# Patient Record
Sex: Female | Born: 1995 | Race: Black or African American | Hispanic: No | Marital: Single | Smoking: Never smoker
Health system: Southern US, Community
[De-identification: ages and names within clinical notes are randomized; demographics above are authoritative.]

## PROBLEM LIST (undated history)

## (undated) ENCOUNTER — Emergency Department (HOSPITAL_COMMUNITY): Discharge status: Absent without leave | Payer: Self-pay

## (undated) DIAGNOSIS — K9041 Non-celiac gluten sensitivity: Secondary | ICD-10-CM

## (undated) HISTORY — PX: TONSILLECTOMY: SUR1361

## (undated) HISTORY — PX: ADENOIDECTOMY: SUR15

---
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
## 2018-02-09 IMAGING — DX DG LUMBAR SPINE COMPLETE 4+V
5 series · 5 of 5 positions shown · non-contrast
Comparison: Abdominal radiograph performed 06/18/2011

CLINICAL DATA: Subacute onset of lower back pain, left greater than
right. Initial encounter.

EXAM:
LUMBAR SPINE - COMPLETE 4+ VIEW

[l-spine ap]
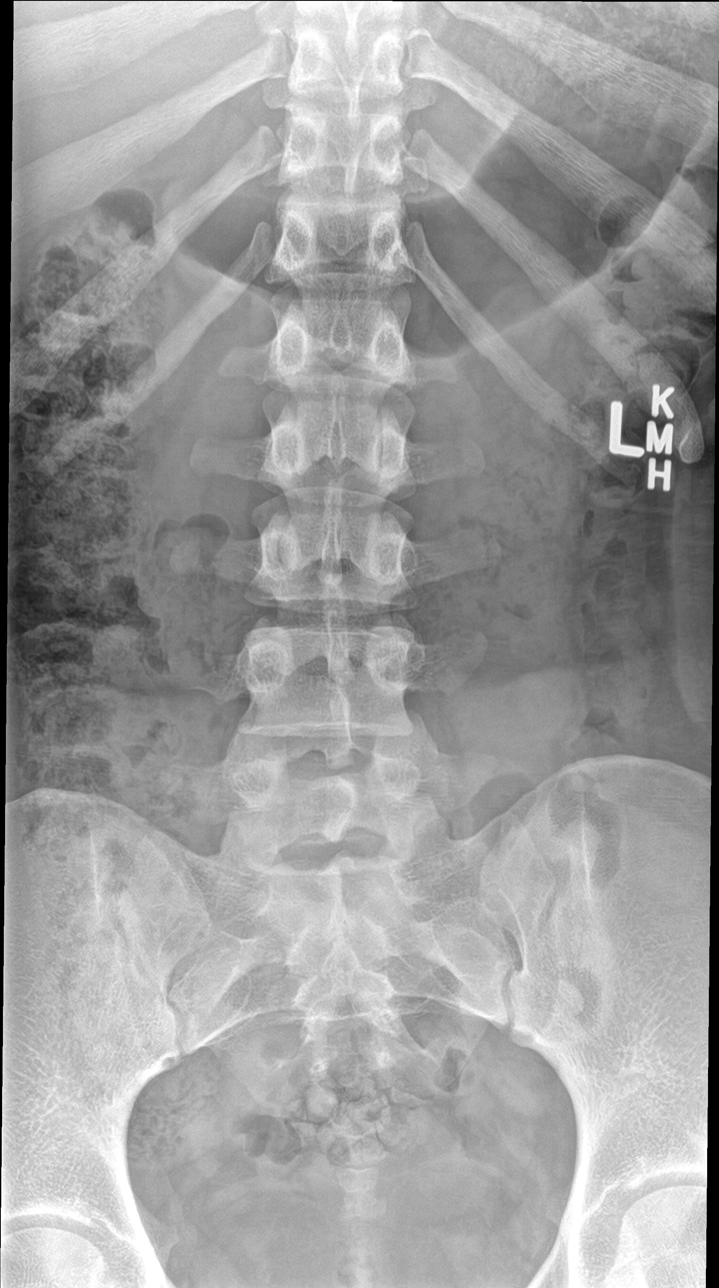

[l-spine obl (1 of 2)]
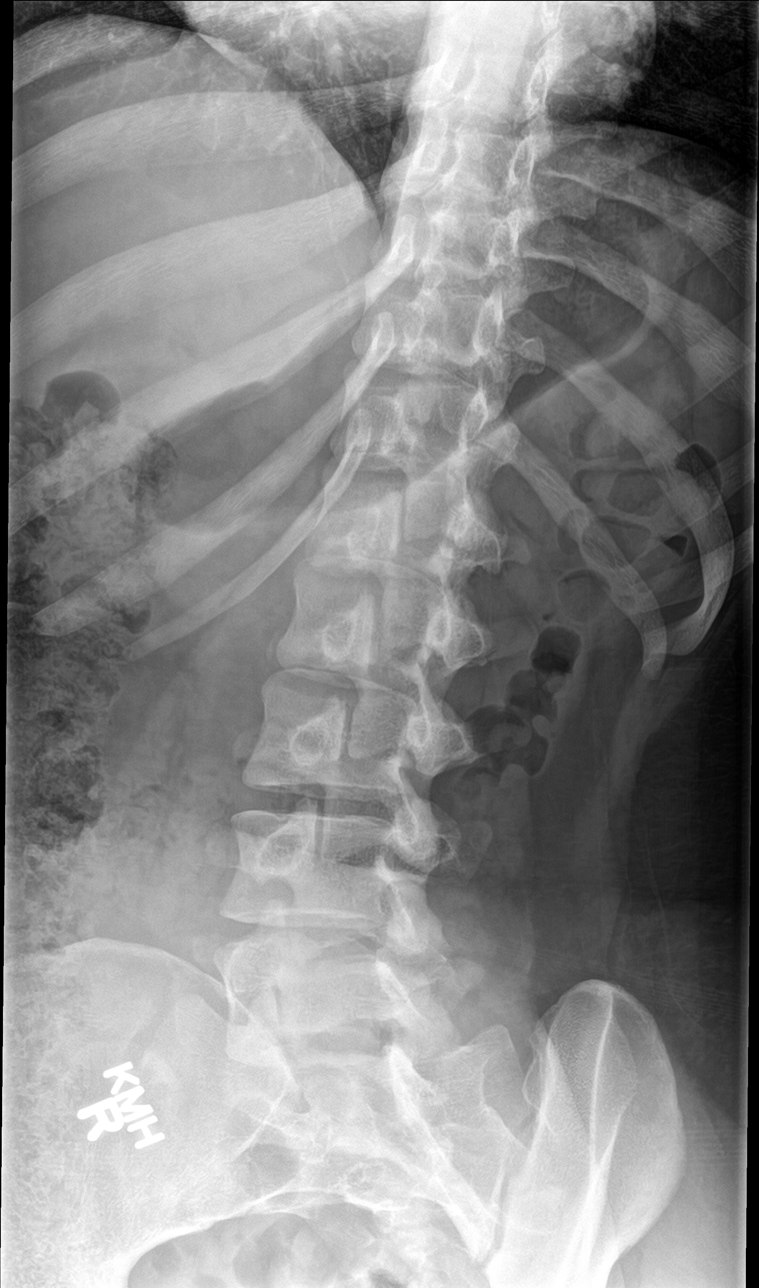

[l-spine obl (2 of 2)]
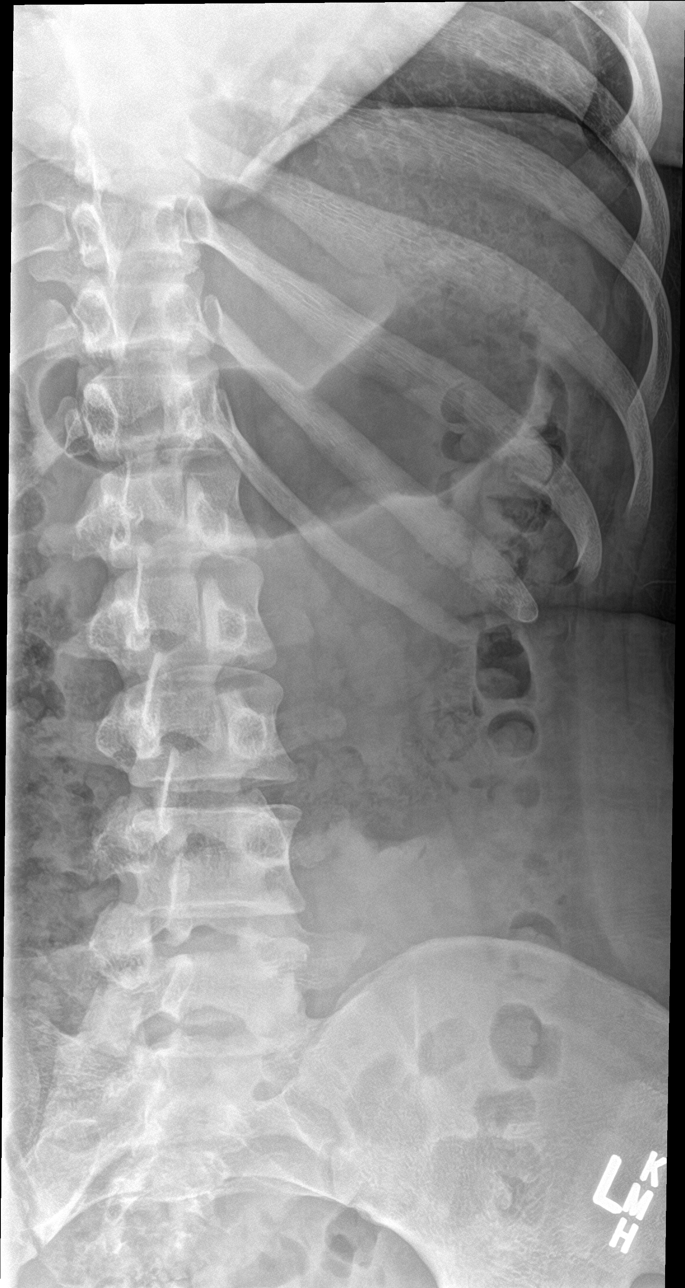

[l-spine lat]
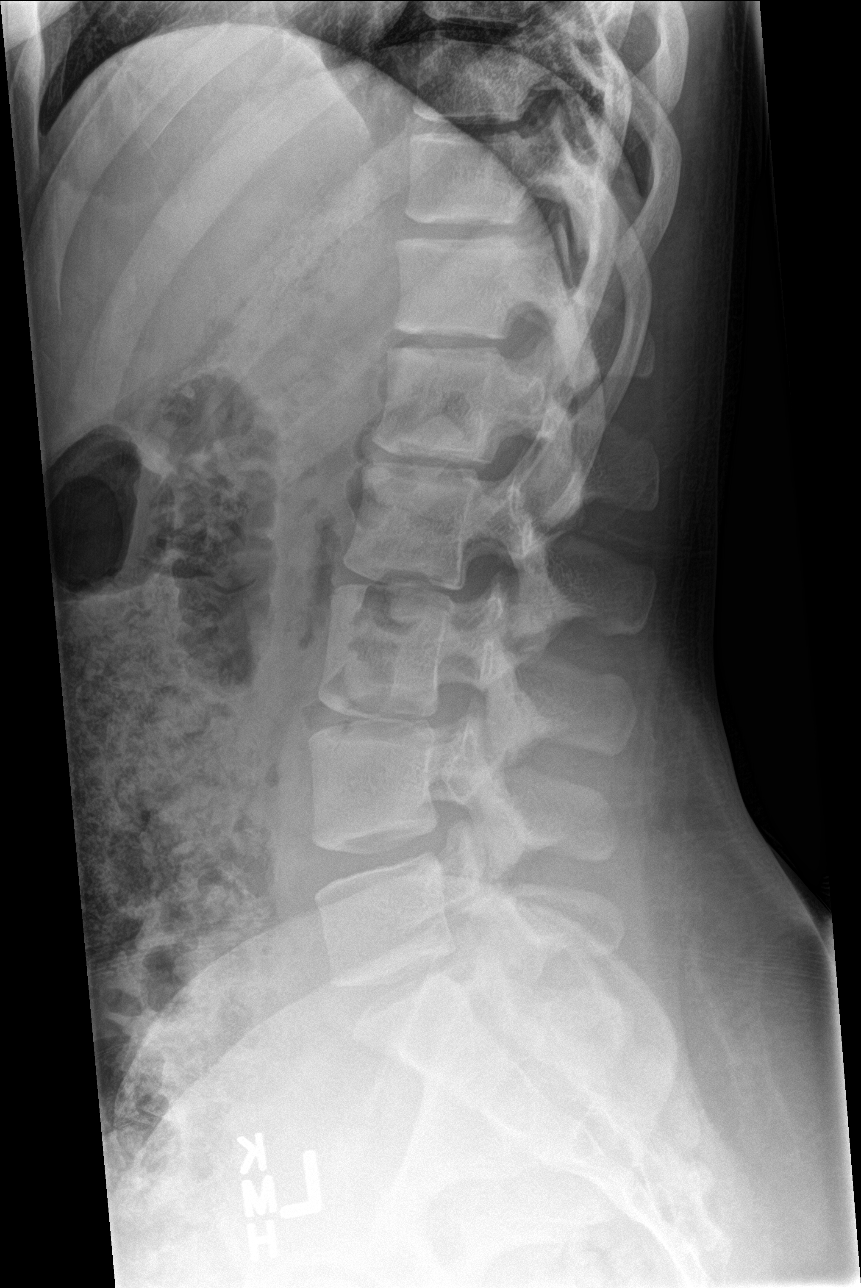

[l-spine spot]
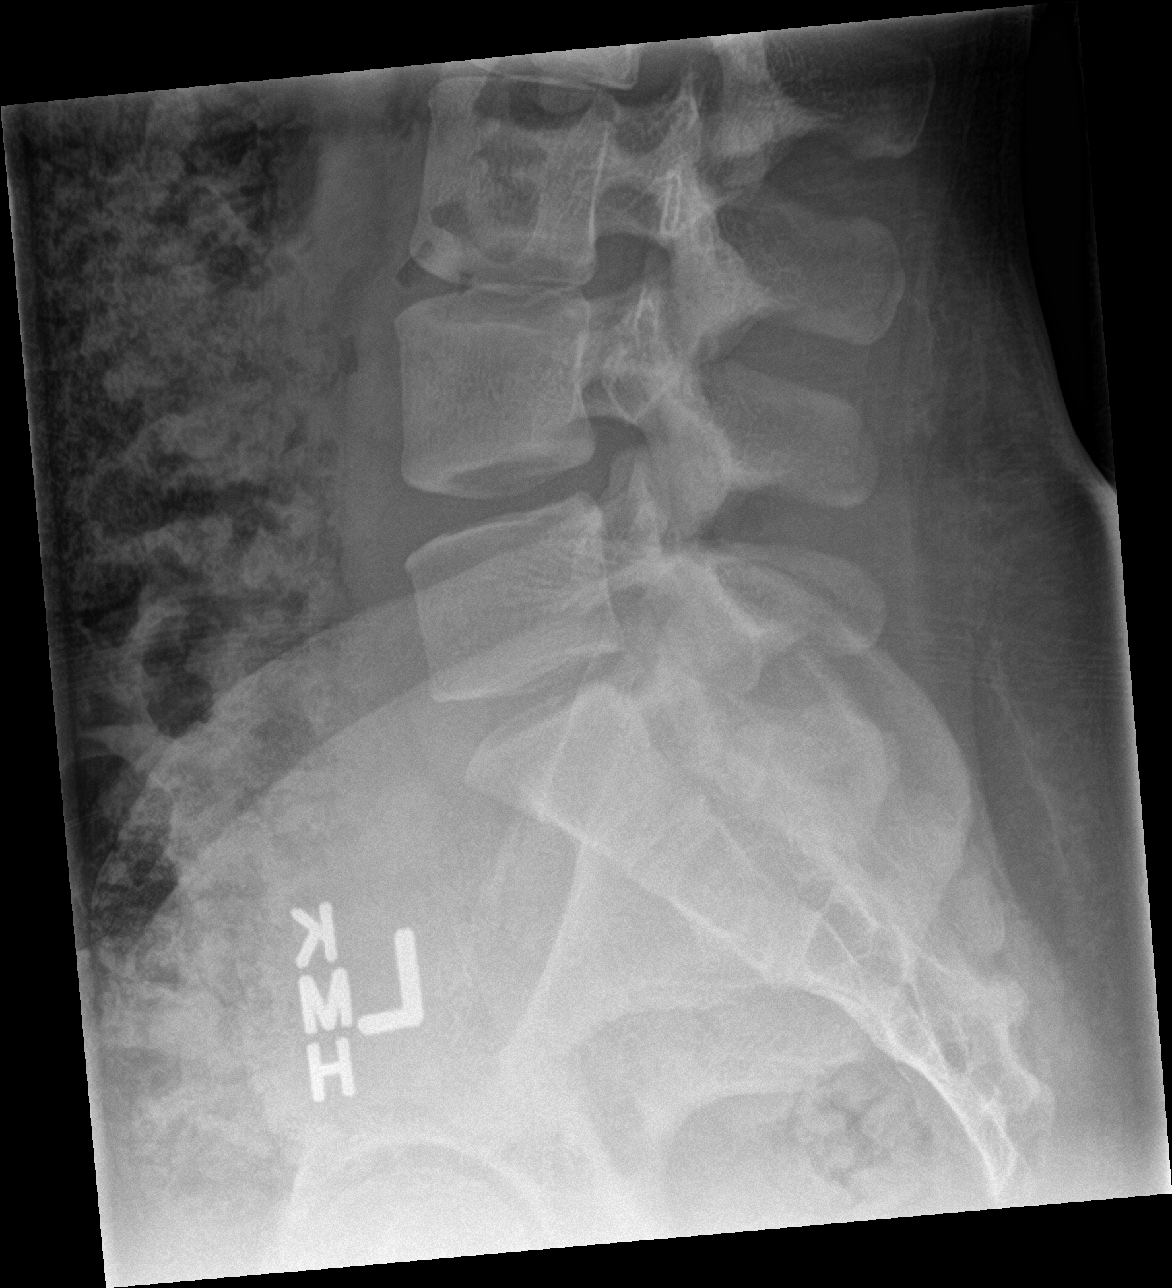

[5 of 5 positions shown; findings below may reference images not displayed]

FINDINGS: There is no evidence of fracture or subluxation. Vertebral bodies
demonstrate normal height and alignment. Intervertebral disc spaces
are preserved. The visualized neural foramina are grossly
unremarkable in appearance.

The visualized bowel gas pattern is unremarkable in appearance; air
and stool are noted within the colon. The sacroiliac joints are
within normal limits.
IMPRESSION: No evidence of fracture or subluxation along the lumbar spine.

## 2019-01-05 ENCOUNTER — Other Ambulatory Visit: Payer: Self-pay

## 2019-01-05 DIAGNOSIS — Z20822 Contact with and (suspected) exposure to covid-19: Secondary | ICD-10-CM

## 2019-01-07 LAB — NOVEL CORONAVIRUS, NAA: SARS-CoV-2, NAA: NOT DETECTED
